# Patient Record
Sex: Female | Born: 1955 | Race: White | Hispanic: No | Marital: Married | Smoking: Never smoker
Health system: Southern US, Community
[De-identification: ages and names within clinical notes are randomized; demographics above are authoritative.]

## PROBLEM LIST (undated history)

## (undated) DIAGNOSIS — N200 Calculus of kidney: Secondary | ICD-10-CM

## (undated) DIAGNOSIS — E079 Disorder of thyroid, unspecified: Secondary | ICD-10-CM

## (undated) DIAGNOSIS — E039 Hypothyroidism, unspecified: Secondary | ICD-10-CM

## (undated) DIAGNOSIS — I341 Nonrheumatic mitral (valve) prolapse: Secondary | ICD-10-CM

## (undated) DIAGNOSIS — R011 Cardiac murmur, unspecified: Secondary | ICD-10-CM

## (undated) DIAGNOSIS — M1990 Unspecified osteoarthritis, unspecified site: Secondary | ICD-10-CM

## (undated) HISTORY — PX: BUNIONECTOMY: SHX129

## (undated) HISTORY — PX: NECK SURGERY: SHX720

## (undated) HISTORY — PX: TONSILLECTOMY: SUR1361

## (undated) HISTORY — PX: APPENDECTOMY: SHX54

---

## 2004-03-25 ENCOUNTER — Ambulatory Visit (HOSPITAL_COMMUNITY): Admission: RE | Admit: 2004-03-25 | Discharge: 2004-03-25 | Payer: Self-pay | Admitting: Neurosurgery

## 2005-07-07 ENCOUNTER — Ambulatory Visit: Payer: Self-pay | Admitting: Unknown Physician Specialty

## 2005-07-07 HISTORY — PX: COLONOSCOPY: SHX174

## 2008-05-27 ENCOUNTER — Ambulatory Visit: Payer: Self-pay | Admitting: Interventional Radiology

## 2008-05-27 ENCOUNTER — Ambulatory Visit: Payer: Self-pay | Admitting: Family Medicine

## 2008-05-27 ENCOUNTER — Emergency Department (HOSPITAL_BASED_OUTPATIENT_CLINIC_OR_DEPARTMENT_OTHER): Admission: EM | Admit: 2008-05-27 | Discharge: 2008-05-27 | Payer: Self-pay | Admitting: Emergency Medicine

## 2008-07-21 ENCOUNTER — Ambulatory Visit (HOSPITAL_COMMUNITY): Admission: RE | Admit: 2008-07-21 | Discharge: 2008-07-23 | Payer: Self-pay | Admitting: Neurosurgery

## 2009-10-21 ENCOUNTER — Encounter: Admission: RE | Admit: 2009-10-21 | Discharge: 2009-10-21 | Payer: Self-pay | Admitting: Neurosurgery

## 2010-03-30 ENCOUNTER — Ambulatory Visit: Payer: Self-pay | Admitting: Family Medicine

## 2010-06-14 LAB — URINALYSIS, ROUTINE W REFLEX MICROSCOPIC
Bilirubin Urine: NEGATIVE
Hgb urine dipstick: NEGATIVE
Ketones, ur: NEGATIVE mg/dL
Nitrite: NEGATIVE
Specific Gravity, Urine: 1.014 (ref 1.005–1.030)
Urobilinogen, UA: 0.2 mg/dL (ref 0.0–1.0)

## 2010-06-14 LAB — CBC
HCT: 40 % (ref 36.0–46.0)
Hemoglobin: 14 g/dL (ref 12.0–15.0)
RBC: 4.4 MIL/uL (ref 3.87–5.11)
RDW: 13.1 % (ref 11.5–15.5)
WBC: 6.5 10*3/uL (ref 4.0–10.5)

## 2010-06-14 LAB — COMPREHENSIVE METABOLIC PANEL
ALT: 29 U/L (ref 0–35)
Alkaline Phosphatase: 69 U/L (ref 39–117)
BUN: 14 mg/dL (ref 6–23)
CO2: 32 mEq/L (ref 19–32)
Chloride: 102 mEq/L (ref 96–112)
GFR calc non Af Amer: >60 mL/min (ref >60)
Glucose, Bld: 85 mg/dL (ref 70–99)
Potassium: 3.7 mEq/L (ref 3.5–5.1)
Sodium: 139 mEq/L (ref 135–145)
Total Bilirubin: 1 mg/dL (ref 0.3–1.2)

## 2010-06-14 LAB — PROTIME-INR: INR: 1 (ref 0.00–1.49)

## 2010-06-14 LAB — DIFFERENTIAL
Basophils Absolute: 0 10*3/uL (ref 0.0–0.1)
Basophils Relative: 1 % (ref 0–1)
Eosinophils Absolute: 0.1 10*3/uL (ref 0.0–0.7)
Monocytes Relative: 7 % (ref 3–12)
Neutro Abs: 4.3 10*3/uL (ref 1.7–7.7)
Neutrophils Relative %: 66 % (ref 43–77)

## 2010-06-14 LAB — APTT: aPTT: 37 seconds (ref 24–37)

## 2010-06-16 LAB — CBC
HCT: 38.2 % (ref 36.0–46.0)
Hemoglobin: 13.5 g/dL (ref 12.0–15.0)
MCHC: 35.3 g/dL (ref 30.0–36.0)
Platelets: 233 10*3/uL (ref 150–400)
RDW: 11.7 % (ref 11.5–15.5)

## 2010-06-16 LAB — DIFFERENTIAL
Basophils Absolute: 0 10*3/uL (ref 0.0–0.1)
Basophils Relative: 0 % (ref 0–1)
Eosinophils Absolute: 0 10*3/uL (ref 0.0–0.7)
Eosinophils Relative: 1 % (ref 0–5)
Monocytes Absolute: 0.4 10*3/uL (ref 0.1–1.0)

## 2010-06-16 LAB — GRAM STAIN

## 2010-06-16 LAB — BASIC METABOLIC PANEL
BUN: 12 mg/dL (ref 6–23)
CO2: 31 mEq/L (ref 19–32)
Glucose, Bld: 96 mg/dL (ref 70–99)
Potassium: 3.9 mEq/L (ref 3.5–5.1)
Sodium: 142 mEq/L (ref 135–145)

## 2010-06-16 LAB — CSF CELL COUNT WITH DIFFERENTIAL
RBC Count, CSF: 1280 /mm3 — ABNORMAL HIGH
Tube #: 1
Tube #: 4

## 2010-06-16 LAB — PROTIME-INR: Prothrombin Time: 13.8 seconds (ref 11.6–15.2)

## 2010-06-16 LAB — CSF CULTURE W GRAM STAIN: Culture: NO GROWTH

## 2010-07-10 ENCOUNTER — Emergency Department: Payer: Self-pay | Admitting: Emergency Medicine

## 2010-07-19 NOTE — H&P (Signed)
NAMESENIAH, Molly Wells              ACCOUNT NO.:  [id number]   MEDICAL RECORD NO.:  [id number]          PATIENT TYPE:  OIB   LOCATION:  3034                         FACILITY:  [facility]   PHYSICIAN:  Payton Doughty, M.D.      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   BODY OF TEXT:  This is a very nice self-referred 55 year old right-  handed white girl, been following for about 4 years with cervical  spondylosis.  She has had increasing pain in her neck and down her arm  on the right and she is now reached the point of intractability with  that and is admitted for an anterior decompression and fusion at C5-6  and C6-7.  Her medical history is remarkable for mitral valve prolapse.  She has had tonsillectomy and appendectomy and bunionectomy.   ALLERGIES:  SULFA, AMOXICILLIN, and BEXTRA.   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   FAMILY HISTORY:  Mom died at 48 with hypertension, arthritis,  hypothyroidism.  She had stroke and diabetes.  Dad died at 14 with  hypertension and pancreatic cancer.   REVIEW OF SYSTEMS:  Remarkable for wearing glasses, neck pain.   PHYSICAL EXAMINATION:  HEENT:  Normal limits.  NECK:  She has good range of motion in her neck, caries are slightly  uniflex.  CHEST:  Clear.  CARDIAC:  Regular rate and rhythm with midsystolic click, fairly  evident.  ABDOMEN:  Nontender.  No hepatosplenomegaly.  EXTREMITIES:  No clubbing, cyanosis.  GU:  Deferred.  Peripheral pulses are good.  NEUROLOGIC:  She is awake, alert, and oriented.  Cranial nerves are  intact.  Motor exam shows 5/5 strength throughout the left upper  extremity and right upper extremity, she has weakness in her right  triceps about 5-/5.  Sensation is diminished in right C6 and C7  distribution.  Reflexes are 2 at the biceps, 2  at the left triceps, 1 at  the right triceps, 1 at the brachioradialis.  Lower extremities are  nonmyelopathic.   MR shows spondylosis at both C5-6 and C6-7.   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   Plans for anterior decompression and fusion at C5-6 and C6-7.  The risks  and benefits have been discussed with her and she wished to proceed.           ______________________________  Payton Doughty, M.D.     MWR/MEDQ  D:  07/21/2008  T:  07/21/2008  Job:  [id number]

## 2010-07-19 NOTE — Op Note (Signed)
NAMEJANISA, LABUS              ACCOUNT NO.:  [id number]   MEDICAL RECORD NO.:  [id number]          PATIENT TYPE:  OIB   LOCATION:  3034                         FACILITY:  [facility]   PHYSICIAN:  Payton Doughty, M.D.      DATE OF BIRTH:  Jan 14, 1956   DATE OF PROCEDURE:  07/21/2008  DATE OF DISCHARGE:                               OPERATIVE REPORT   Jul 21, 2008   PREOPERATIVE DIAGNOSIS:  Spondylosis C5-C6 and C6-C7.   POSTOPERATIVE DIAGNOSIS:  Spondylosis C5-C6 and C6-C7.   PROCEDURE:  C5-C6 and C6-C7 anterior decompression and fusion.   SURGEON:  Payton Doughty, MD   ANESTHESIA:  General endotracheal.   PREPARATION:  Prepped and draped with alcohol wipe.   COMPLICATIONS:  None.   NURSE ASSISTANT:  Bedelia Person, MD   DOCTOR ASSISTANCE:  Hewitt Shorts, MD   BODY OF TEXT:  A 55 year old girl with spondylosis at C5-C6 and C6-C7  taken to the operative room, smoothly anesthetized and intubated and was  placed spine on the operating table.  Following shave, prep, and drape  in the usual sterile fashion, the skin was incised from midline to  medial border of the sternocleidomastoid muscle on the left side.  The  platysma was identified, elevated, divided and undermined.  The  sternocleidomastoid was identified.  Medial dissection revealed the  carotid artery tracked laterally to the left.  Trachea and esophagus  were retracted laterally to the right exposing the bones of the anterior  cervical spine.  Marker was placed.  Intraoperative x-ray obtained,  confirmed correctness while under hand retraction of the esophagus and  trachea a diskectomy was carried out under gross observation of both C5-  C6 and C6-C7.  The operating microscope was then brought in.  We used  microdissection technique to remove the remaining disks, explored the  neural foramina, divided the posterior longitudinal ligament and removed  the offending osteophytes.  It was found that the spondylosis was a  little  bit worse at C6-C7 than it was at C5-C6 with significant at both  levels, worse on the right than on the left.  Following complete  decompression, similar bone grafts were fashioned patellar allograft and  tapped into place.  A 32-mm Reflex Hybrid plate was placed using 12-mm  screws two in C5, two in C6 and two in C7.  Intraoperative x-ray showed  good placement of bone graft, plate and screws.  Wound was irrigated.  Hemostasis assured.  Successive layers of 3-0 Vicryl and 4-0 Vicryl were  used to close the platysma, subcutaneous tissue and skin respectively.  Benzoin and Steri-Strips were placed and made occlusive with Telfa and  OpSite.  The patient placed in Aspen collar and returned to recovery  room in good condition.   .           ______________________________  Payton Doughty, M.D.     MWR/MEDQ  D:  07/21/2008  T:  07/21/2008  Job:  [id number]

## 2011-02-20 ENCOUNTER — Ambulatory Visit: Payer: Self-pay

## 2011-07-02 ENCOUNTER — Ambulatory Visit: Payer: Self-pay | Admitting: Family Medicine

## 2011-07-04 LAB — BETA STREP CULTURE(ARMC)

## 2014-03-12 ENCOUNTER — Ambulatory Visit: Payer: Self-pay | Admitting: Podiatry

## 2015-07-18 ENCOUNTER — Ambulatory Visit
Admission: EM | Admit: 2015-07-18 | Discharge: 2015-07-18 | Disposition: A | Discharge status: Home / Self Care | Payer: Managed Care, Other (non HMO) | Attending: Family Medicine | Admitting: Family Medicine

## 2015-07-18 ENCOUNTER — Ambulatory Visit (INDEPENDENT_AMBULATORY_CARE_PROVIDER_SITE_OTHER): Payer: Managed Care, Other (non HMO)

## 2015-07-18 DIAGNOSIS — S63501A Unspecified sprain of right wrist, initial encounter: Secondary | ICD-10-CM

## 2015-07-18 DIAGNOSIS — M19039 Primary osteoarthritis, unspecified wrist: Secondary | ICD-10-CM

## 2015-07-18 DIAGNOSIS — M129 Arthropathy, unspecified: Secondary | ICD-10-CM | POA: Diagnosis not present

## 2015-07-18 HISTORY — DX: Nonrheumatic mitral (valve) prolapse: I34.1

## 2015-07-18 HISTORY — DX: Disorder of thyroid, unspecified: E07.9

## 2015-07-18 HISTORY — DX: Unspecified osteoarthritis, unspecified site: M19.90

## 2015-07-18 MED ORDER — TRIAMCINOLONE ACETONIDE 40 MG/ML IJ SUSP
40.0000 mg | Freq: Once | INTRAMUSCULAR | Status: AC
  Administered 2015-07-18: 40 mg via INTRA_ARTICULAR

## 2015-07-18 MED ORDER — ETODOLAC 500 MG PO TABS: 500.0000 mg | ORAL_TABLET | Freq: Two times a day (BID) | ORAL | Status: AC

## 2015-07-18 NOTE — ED Notes (Signed)
Right hand and wrist pain onset today very painful when she woke up plays piano and has little swelling and Hx of arthritis.

## 2015-07-18 NOTE — ED Provider Notes (Signed)
CSN: [id number]     Arrival date & time 07/18/15  6578 History   First MD Initiated Contact with Patient 07/18/15 0930     Chief Complaint  Patient presents with  . Wrist Pain    Right side   HPI the patient presents today for evaluation of the right wrist pain. The pain began this morning when the patient 1st woke up. She was supposed to go to church and played piano however the pain was too severe that she elected to be seen by urgent care. Patient denies any recent falls or trauma to the right wrist, denies any numbness or tingling to the right upper extremity. The pain is located along the ulnar aspect of the distal wrist, she describes opinions and aching sensation which is worse with range of motion. Pain is also worse when she attempts to grab objects or tries to make a full composite fist. 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 Denies any history of Bites or tick bites. She does note that she will often toss and turn in her sleep.  Past Medical History  Diagnosis Date  . Arthritis   . Thyroid disease   . Mitral valve prolapse    Past Surgical History  Procedure Laterality Date  . Appendectomy    . Tonsillectomy    . Neck surgery      cervical fusion  . Bunionectomy     No family history on file. Social History  Substance Use Topics  . Smoking status: Never Smoker   . Smokeless tobacco: None  . Alcohol Use: No   OB History    No data available     Review of Systems  Constitutional: Negative.   HENT: Negative.   Eyes: Negative.   Respiratory: Negative.   Cardiovascular: Negative.   Gastrointestinal: Negative.   Endocrine: Negative.   Genitourinary: Negative.   Musculoskeletal: Positive for myalgias, joint swelling and arthralgias.  Skin: Negative.   Allergic/Immunologic: Negative.   Neurological: Negative.   Hematological: Negative.   Psychiatric/Behavioral: Negative.     Allergies  Amoxicillin and  Sulfa antibiotics  Home Medications   Prior to Admission medications   Medication Sig Start Date End Date Taking? Authorizing Provider  levothyroxine (SYNTHROID, LEVOTHROID) 50 MCG tablet Take 50 mcg by mouth daily before breakfast.   Yes Historical Provider, MD  naproxen (NAPROSYN) 500 MG tablet Take 500 mg by mouth as needed.   Yes Historical Provider, MD  etodolac (LODINE) 500 MG tablet Take 1 tablet (500 mg total) by mouth 2 (two) times daily. 07/18/15   Anson Oregon, PA-C   Meds Ordered and Administered this Visit   Medications  triamcinolone acetonide (KENALOG-40) injection 40 mg (40 mg Intra-articular Given 07/18/15 1109)    BP 142/79 mmHg  Pulse 70  Temp(Src) 98.1 F (36.7 C) (Oral)  Resp 16  Ht 5' 3.5" (1.613 m)  Wt 127 lb (57.607 kg)  BMI 22.14 kg/m2  SpO2 100%  LMP  No data found.   Physical Exam  skin inspection of the right wrist demonstrates mild swelling along the dorsal, ulnar aspect. The patient has pain with palpation of the distal ulna, at the area of the ulnar collateral ligament. Patient denies any parent palpation of the radial aspect of the wrist. No anatomic snuffbox tenderness. Patient has full range of motion to the right wrist and extension, flexion, ulnar deviation in radial deviation. Patient has mild pain with wrist extension,  moderate pain with owner deviation. Patient is able make it for composite fist, 5+/font grip strength. Patient is intact light touch the right upper extremity. No triggering or liking of digits.  ED Course  Injection of joint Date/Time: 07/18/2015 11:20 AM Performed by: Anson OregonMCGHEE, Saylor Murry LANCE Authorized by: Hassan RowanWADE, EUGENE Consent: Verbal consent obtained. Risks and benefits: risks, benefits and alternatives were discussed Consent given by: patient Patient understanding: patient states understanding of the procedure being performed Patient consent: the patient's understanding of the procedure matches consent given Procedure  consent: procedure consent matches procedure scheduled Relevant documents: relevant documents present and verified Test results: test results available and properly labeled Site marked: the operative site was marked Imaging studies: imaging studies available Patient identity confirmed: verbally with patient Local anesthesia used: no Patient sedated: no Comments: Skin was cleaned prior to injection with alcohol swab.  1 cc of Kenalog-40 was injected into the area of the Ulnar collateral ligament from the dorsal aspect of the right hand.   (including critical care time)  Labs Review Labs Reviewed - No data to display  Imaging Review Dg Hand Complete Right  07/18/2015  CLINICAL DATA:  History of arthritis woke up this morning with medial right hand pain. No trauma. EXAM: RIGHT HAND - COMPLETE 3+ VIEW COMPARISON:  None. FINDINGS: Mild degenerative change over the wrist. No evidence of fracture or dislocation. Normal mineralization and alignment. No evidence of bony erosions. IMPRESSION: No acute findings. Electronically Signed   By: Elberta Fortisaniel  Boyle M.D.   On: 07/18/2015 11:07   MDM   1. Wrist arthritis   2. Wrist sprain, right, initial encounter    1.  Treatment options were discussed today with the patient. 2.  Pt was offered and received a right wrist kenalog injection, described above. 3.  Lodine prescription written and patient provided with a velcro wrist splint. 4.  She will follow-up with her orthopaedic surgeon if she continues to have pain after 14 day.    Anson OregonJames Lance Rayland Hamed, [place] JerseyPA-C 07/18/15 1128

## 2015-07-18 NOTE — Discharge Instructions (Signed)
Wrist Sprain With Rehab A sprain is an injury in which a ligament that maintains the proper alignment of a joint is partially or completely torn. The ligaments of the wrist are susceptible to sprains. Sprains are classified into three categories. Grade 1 sprains cause pain, but the tendon is not lengthened. Grade 2 sprains include a lengthened ligament because the ligament is stretched or partially ruptured. With grade 2 sprains there is still function, although the function may be diminished. Grade 3 sprains are characterized by a complete tear of the tendon or muscle, and function is usually impaired. SYMPTOMS   Pain tenderness, inflammation, and/or bruising (contusion) of the injury.  A "pop" or tear felt and/or heard at the time of injury.  Decreased wrist function. CAUSES  A wrist sprain occurs when a force is placed on one or more ligaments that is greater than it/they can withstand. Common mechanisms of injury include:  Catching a ball with your hands.  Repetitive and/ or strenuous extension or flexion of the wrist. RISK INCREASES WITH:  Previous wrist injury.  Contact sports (boxing or wrestling).  Activities in which falling is common.  Poor strength and flexibility.  Improperly fitted or padded protective equipment. PREVENTION  Warm up and stretch properly before activity.  Allow for adequate recovery between workouts.  Maintain physical fitness:  Strength, flexibility, and endurance.  Cardiovascular fitness.  Protect the wrist joint by limiting its motion with the use of taping, braces, or splints.  Protect the wrist after injury for 6 to 12 months. PROGNOSIS  The prognosis for wrist sprains depends on the degree of injury. Grade 1 sprains require 2 to 6 weeks of treatment. Grade 2 sprains require 6 to 8 weeks of treatment, and grade 3 sprains require up to 12 weeks.  RELATED COMPLICATIONS   Prolonged healing time, if improperly treated or  re-injured.  Recurrent symptoms that result in a chronic problem.  Injury to nearby structures (bone, cartilage, nerves, or tendons).  Arthritis of the wrist.  Inability to compete in athletics at a high level.  Wrist stiffness or weakness.  Progression to a complete rupture of the ligament. TREATMENT  Treatment initially involves resting from any activities that aggravate the symptoms, and the use of ice and medications to help reduce pain and inflammation. Your caregiver may recommend immobilizing the wrist for a period of time in order to reduce stress on the ligament and allow for healing. After immobilization it is important to perform strengthening and stretching exercises to help regain strength and a full range of motion. These exercises may be completed at home or with a therapist. Surgery is not usually required for wrist sprains, unless the ligament has been ruptured (grade 3 sprain). MEDICATION   If pain medication is necessary, then nonsteroidal anti-inflammatory medications, such as aspirin and ibuprofen, or other minor pain relievers, such as acetaminophen, are often recommended.  Do not take pain medication for 7 days before surgery.  Prescription pain relievers may be given if deemed necessary by your caregiver. Use only as directed and only as much as you need. HEAT AND COLD  Cold treatment (icing) relieves pain and reduces inflammation. Cold treatment should be applied for 10 to 15 minutes every 2 to 3 hours for inflammation and pain and immediately after any activity that aggravates your symptoms. Use ice packs or massage the area with a piece of ice (ice massage).  Heat treatment may be used prior to performing the stretching and strengthening activities prescribed by your   caregiver, physical therapist, or athletic trainer. Use a heat pack or soak your injury in warm water. SEEK MEDICAL CARE IF:  Treatment seems to offer no benefit, or the condition worsens.  Any  medications produce adverse side effects. EXERCISES RANGE OF MOTION (ROM) AND STRETCHING EXERCISES - Wrist Sprain  These exercises may help you when beginning to rehabilitate your injury. Your symptoms may resolve with or without further involvement from your physician, physical therapist or athletic trainer. While completing these exercises, remember:   Restoring tissue flexibility helps normal motion to return to the joints. This allows healthier, less painful movement and activity.  An effective stretch should be held for at least 30 seconds.  A stretch should never be painful. You should only feel a gentle lengthening or release in the stretched tissue. RANGE OF MOTION - Wrist Flexion, Active-Assisted  Extend your right / left elbow with your fingers pointing down.*  Gently pull the back of your hand towards you until you feel a gentle stretch on the top of your forearm.  Hold this position for __________ seconds. Repeat __________ times. Complete this exercise __________ times per day.  *If directed by your physician, physical therapist or athletic trainer, complete this stretch with your elbow bent rather than extended. RANGE OF MOTION - Wrist Extension, Active-Assisted  Extend your right / left elbow and turn your palm upwards.*  Gently pull your palm/fingertips back so your wrist extends and your fingers point more toward the ground.  You should feel a gentle stretch on the inside of your forearm.  Hold this position for __________ seconds. Repeat __________ times. Complete this exercise __________ times per day. *If directed by your physician, physical therapist or athletic trainer, complete this stretch with your elbow bent, rather than extended. RANGE OF MOTION - Supination, Active  Stand or sit with your elbows at your side. Bend your right / left elbow to 90 degrees.  Turn your palm upward until you feel a gentle stretch on the inside of your forearm.  Hold this  position for __________ seconds. Slowly release and return to the starting position. Repeat __________ times. Complete this stretch __________ times per day.  RANGE OF MOTION - Pronation, Active  Stand or sit with your elbows at your side. Bend your right / left elbow to 90 degrees.  Turn your palm downward until you feel a gentle stretch on the top of your forearm.  Hold this position for __________ seconds. Slowly release and return to the starting position. Repeat __________ times. Complete this stretch __________ times per day.  STRETCH - Wrist Flexion  Place the back of your right / left hand on a tabletop leaving your elbow slightly bent. Your fingers should point away from your body.  Gently press the back of your hand down onto the table by straightening your elbow. You should feel a stretch on the top of your forearm.  Hold this position for __________ seconds. Repeat __________ times. Complete this stretch __________ times per day.  STRETCH - Wrist Extension  Place your right / left fingertips on a tabletop leaving your elbow slightly bent. Your fingers should point backwards.  Gently press your fingers and palm down onto the table by straightening your elbow. You should feel a stretch on the inside of your forearm.  Hold this position for __________ seconds. Repeat __________ times. Complete this stretch __________ times per day.  STRENGTHENING EXERCISES - Wrist Sprain These exercises may help you when beginning to rehabilitate your injury.   They may resolve your symptoms with or without further involvement from your physician, physical therapist or athletic trainer. While completing these exercises, remember:   Muscles can gain both the endurance and the strength needed for everyday activities through controlled exercises.  Complete these exercises as instructed by your physician, physical therapist or athletic trainer. Progress with the resistance and repetition exercises  only as your caregiver advises. STRENGTH - Wrist Flexors  Sit with your right / left forearm palm-up and fully supported. Your elbow should be resting below the height of your shoulder. Allow your wrist to extend over the edge of the surface.  Loosely holding a __________ weight or a piece of rubber exercise band/tubing, slowly curl your hand up toward your forearm.  Hold this position for __________ seconds. Slowly lower the wrist back to the starting position in a controlled manner. Repeat __________ times. Complete this exercise __________ times per day.  STRENGTH - Wrist Extensors  Sit with your right / left forearm palm-down and fully supported. Your elbow should be resting below the height of your shoulder. Allow your wrist to extend over the edge of the surface.  Loosely holding a __________ weight or a piece of rubber exercise band/tubing, slowly curl your hand up toward your forearm.  Hold this position for __________ seconds. Slowly lower the wrist back to the starting position in a controlled manner. Repeat __________ times. Complete this exercise __________ times per day.  STRENGTH - Ulnar Deviators  Stand with a ____________________ weight in your right / left hand, or sit holding on to the rubber exercise band/tubing with your opposite arm supported.  Move your wrist so that your pinkie travels toward your forearm and your thumb moves away from your forearm.  Hold this position for __________ seconds and then slowly lower the wrist back to the starting position. Repeat __________ times. Complete this exercise __________ times per day STRENGTH - Radial Deviators  Stand with a ____________________ weight in your  right / left hand, or sit holding on to the rubber exercise band/tubing with your arm supported.  Raise your hand upward in front of you or pull up on the rubber tubing.  Hold this position for __________ seconds and then slowly lower the wrist back to the  starting position. Repeat __________ times. Complete this exercise __________ times per day. STRENGTH - Forearm Supinators  Sit with your right / left forearm supported on a table, keeping your elbow below shoulder height. Rest your hand over the edge, palm down.  Gently grip a hammer or a soup ladle.  Without moving your elbow, slowly turn your palm and hand upward to a "thumbs-up" position.  Hold this position for __________ seconds. Slowly return to the starting position. Repeat __________ times. Complete this exercise __________ times per day.  STRENGTH - Forearm Pronators  Sit with your right / left forearm supported on a table, keeping your elbow below shoulder height. Rest your hand over the edge, palm up.  Gently grip a hammer or a soup ladle.  Without moving your elbow, slowly turn your palm and hand upward to a "thumbs-up" position.  Hold this position for __________ seconds. Slowly return to the starting position. Repeat __________ times. Complete this exercise __________ times per day.  STRENGTH - Grip  Grasp a tennis ball, a dense sponge, or a large, rolled sock in your hand.  Squeeze as hard as you can without increasing any pain.  Hold this position for __________ seconds. Release your grip slowly.   Repeat __________ times. Complete this exercise __________ times per day.    This information is not intended to replace advice given to you by your health care provider. Make sure you discuss any questions you have with your health care provider.   Document Released: 02/20/2005 Document Revised: 11/11/2014 Document Reviewed: 06/04/2008 Elsevier Interactive Patient Education 2016 [employer]  

## 2015-08-05 ENCOUNTER — Encounter: Payer: Self-pay | Admitting: *Deleted

## 2015-08-06 ENCOUNTER — Ambulatory Visit: Payer: Managed Care, Other (non HMO) | Admitting: Certified Registered"

## 2015-08-06 ENCOUNTER — Encounter
Admission: RE | Disposition: A | Discharge status: Home / Self Care | Payer: Self-pay | Source: Ambulatory Visit | Attending: Unknown Physician Specialty

## 2015-08-06 ENCOUNTER — Encounter: Payer: Self-pay | Admitting: *Deleted

## 2015-08-06 ENCOUNTER — Ambulatory Visit
Admission: RE | Admit: 2015-08-06 | Discharge: 2015-08-06 | Disposition: A | Discharge status: Home / Self Care | Payer: Managed Care, Other (non HMO) | Source: Ambulatory Visit | Attending: Unknown Physician Specialty | Admitting: Unknown Physician Specialty

## 2015-08-06 DIAGNOSIS — Z1211 Encounter for screening for malignant neoplasm of colon: Secondary | ICD-10-CM | POA: Diagnosis not present

## 2015-08-06 DIAGNOSIS — M1990 Unspecified osteoarthritis, unspecified site: Secondary | ICD-10-CM | POA: Diagnosis not present

## 2015-08-06 DIAGNOSIS — E039 Hypothyroidism, unspecified: Secondary | ICD-10-CM | POA: Insufficient documentation

## 2015-08-06 DIAGNOSIS — Z87442 Personal history of urinary calculi: Secondary | ICD-10-CM | POA: Insufficient documentation

## 2015-08-06 DIAGNOSIS — I341 Nonrheumatic mitral (valve) prolapse: Secondary | ICD-10-CM | POA: Diagnosis not present

## 2015-08-06 DIAGNOSIS — Z79899 Other long term (current) drug therapy: Secondary | ICD-10-CM | POA: Diagnosis not present

## 2015-08-06 DIAGNOSIS — R011 Cardiac murmur, unspecified: Secondary | ICD-10-CM | POA: Insufficient documentation

## 2015-08-06 HISTORY — DX: Hypothyroidism, unspecified: E03.9

## 2015-08-06 HISTORY — DX: Cardiac murmur, unspecified: R01.1

## 2015-08-06 HISTORY — DX: Calculus of kidney: N20.0

## 2015-08-06 HISTORY — PX: COLONOSCOPY WITH PROPOFOL: SHX5780

## 2015-08-06 SURGERY — COLONOSCOPY WITH PROPOFOL
Anesthesia: General

## 2015-08-06 MED ORDER — PROPOFOL 500 MG/50ML IV EMUL
INTRAVENOUS | Status: DC | PRN
  Administered 2015-08-06: 120 ug/kg/min via INTRAVENOUS

## 2015-08-06 MED ORDER — PROPOFOL 10 MG/ML IV BOLUS
INTRAVENOUS | Status: DC | PRN
  Administered 2015-08-06: 30 mg via INTRAVENOUS
  Administered 2015-08-06: 70 mg via INTRAVENOUS

## 2015-08-06 MED ORDER — SODIUM CHLORIDE 0.9 % IV SOLN
INTRAVENOUS | Status: DC
  Administered 2015-08-06: 15:00:00 via INTRAVENOUS

## 2015-08-06 MED ORDER — MIDAZOLAM HCL 5 MG/5ML IJ SOLN
INTRAMUSCULAR | Status: DC | PRN
  Administered 2015-08-06: 1 mg via INTRAVENOUS

## 2015-08-06 MED ORDER — LIDOCAINE 2% (20 MG/ML) 5 ML SYRINGE
INTRAMUSCULAR | Status: DC | PRN
  Administered 2015-08-06: 50 mg via INTRAVENOUS

## 2015-08-06 MED ORDER — SODIUM CHLORIDE 0.9 % IV SOLN: INTRAVENOUS | Status: DC

## 2015-08-06 NOTE — Anesthesia Preprocedure Evaluation (Signed)
Anesthesia Evaluation  Patient identified by MRN, date of birth, ID band Patient awake    Reviewed: Allergy & Precautions, NPO status , Patient's Chart, lab work & pertinent test results, reviewed documented beta blocker date and time   Airway Mallampati: II  TM Distance: >3 FB     Dental  (+) Chipped   Pulmonary           Cardiovascular + Valvular Problems/Murmurs MVP      Neuro/Psych    GI/Hepatic   Endo/Other  Hypothyroidism   Renal/GU Renal InsufficiencyRenal disease     Musculoskeletal  (+) Arthritis ,   Abdominal   Peds  Hematology   Anesthesia Other Findings Neck movement adequate.  Reproductive/Obstetrics                             Anesthesia Physical Anesthesia Plan  ASA: III  Anesthesia Plan: General   Post-op Pain Management:    Induction: Intravenous  Airway Management Planned: Nasal Cannula  Additional Equipment:   Intra-op Plan:   Post-operative Plan:   Informed Consent: I have reviewed the patients History and Physical, chart, labs and discussed the procedure including the risks, benefits and alternatives for the proposed anesthesia with the patient or authorized representative who has indicated his/her understanding and acceptance.     Plan Discussed with: CRNA  Anesthesia Plan Comments:         Anesthesia Quick Evaluation

## 2015-08-06 NOTE — Op Note (Signed)
[facility] Patient Name: Molly Wells Procedure Date: 08/06/2015 3:42 PM MRN: [id number] Account #: [id number] Date of Birth: May 15, 1955 Admit Type: Outpatient Age: 60 Room: [facility] ENDO ROOM 4 Gender: Female Note Status: Finalized Procedure:            Colonoscopy Indications:          Screening for colorectal malignant neoplasm Providers:            Scot Jun, MD Referring MD:         Marylin Crosby. Greggory Stallion, MD (Referring MD) Medicines:            Propofol per Anesthesia Complications:        No immediate complications. Procedure:            Pre-Anesthesia Assessment:                       - After reviewing the risks and benefits, the patient                        was deemed in satisfactory condition to undergo the                        procedure.                       After obtaining informed consent, the colonoscope was                        passed under direct vision. Throughout the procedure,                        the patient's blood pressure, pulse, and oxygen                        saturations were monitored continuously. The                        Colonoscope was introduced through the anus and                        advanced to the the cecum, identified by appendiceal                        orifice and ileocecal valve. The colonoscopy was                        performed without difficulty. The patient tolerated the                        procedure well. The quality of the bowel preparation                        was excellent. Findings:      The entire examined colon appeared normal. No significant hemorrhoids       seen. Impression:           - The entire examined colon is normal.                       - No specimens collected. Recommendation:       - Repeat colonoscopy in 10 years for screening purposes.  Scot Junobert T Braylynn Lewing, MD 08/06/2015 4:49:24 PM This report has been signed electronically. Number of Addenda: 0 Note Initiated  On: 08/06/2015 3:42 PM Scope Withdrawal Time: 0 hours 7 minutes 15 seconds  Total Procedure Duration: 0 hours 14 minutes 0 seconds       [facility]

## 2015-08-06 NOTE — H&P (Signed)
   Primary Care Physician:  Rayetta HumphreyGeorge, Sionne A, MD Primary Gastroenterologist:  Dr. Mechele CollinElliott  Pre-Procedure History & Physical: HPI:  Molly Wells is a 60 y.o. female is here for an colonoscopy.   Past Medical History  Diagnosis Date  . Arthritis   . Thyroid disease   . Mitral valve prolapse   . Hypothyroidism   . Kidney stones   . Heart murmur     Past Surgical History  Procedure Laterality Date  . Appendectomy    . Tonsillectomy    . Neck surgery      cervical fusion  . Bunionectomy    . Colonoscopy  07/07/2005    Prior to Admission medications   Medication Sig Start Date End Date Taking? Authorizing Provider  levothyroxine (SYNTHROID, LEVOTHROID) 50 MCG tablet Take 50 mcg by mouth daily before breakfast.   Yes Historical Provider, MD  etodolac (LODINE) 500 MG tablet Take 1 tablet (500 mg total) by mouth 2 (two) times daily. 07/18/15   Anson OregonJames Lance McGhee, PA-C  naproxen (NAPROSYN) 500 MG tablet Take 500 mg by mouth as needed.    Historical Provider, MD    Allergies as of 07/27/2015 - Review Complete 07/18/2015  Allergen Reaction Noted  . Amoxicillin Rash 07/18/2015  . Sulfa antibiotics Rash 07/18/2015    History reviewed. No pertinent family history.  Social History   Social History  . Marital Status: Married    Spouse Name: N/A  . Number of Children: N/A  . Years of Education: N/A   Occupational History  . Not on file.   Social History Main Topics  . Smoking status: Never Smoker   . Smokeless tobacco: Never Used  . Alcohol Use: No  . Drug Use: No  . Sexual Activity: Not on file   Other Topics Concern  . Not on file   Social History Narrative    Review of Systems: See HPI, otherwise negative ROS  Physical Exam: BP 149/82 mmHg  Pulse 80  Temp(Src) 97.7 F (36.5 C) (Tympanic)  Resp 18  Ht 5\' 5"  (1.651 m)  Wt 55.792 kg (123 lb)  BMI 20.47 kg/m2  SpO2 100% General:   Alert,  pleasant and cooperative in NAD Head:  Normocephalic and  atraumatic. Neck:  Supple; no masses or thyromegaly. Lungs:  Clear throughout to auscultation.    Heart:  Regular rate and rhythm. Abdomen:  Soft, nontender and nondistended. Normal bowel sounds, without guarding, and without rebound.   Neurologic:  Alert and  oriented x4;  grossly normal neurologically.  Impression/Plan: Molly Wells is here for an colonoscopy to be performed for screening  Risks, benefits, limitations, and alternatives regarding  colonoscopy have been reviewed with the patient.  Questions have been answered.  All parties agreeable.   Lynnae PrudeELLIOTT, ROBERT, MD  08/06/2015, 3:50 PM

## 2015-08-06 NOTE — Transfer of Care (Signed)
Immediate Anesthesia Transfer of Care Note  Patient: Molly Wells  Procedure(s) Performed: Procedure(s): COLONOSCOPY WITH PROPOFOL (N/A)  Patient Location: Endoscopy Unit  Anesthesia Type:General  Level of Consciousness: awake  Airway & Oxygen Therapy: Patient Spontanous Breathing and Patient connected to nasal cannula oxygen  Post-op Assessment: Report given to RN and Post -op Vital signs reviewed and stable  Post vital signs: Reviewed  Last Vitals:  Filed Vitals:   08/06/15 1431 08/06/15 1616  BP: 149/82 115/73  Pulse: 80 67  Temp: 36.5 C 36.2 C  Resp: 18 18    Last Pain: There were no vitals filed for this visit.       Complications: No apparent anesthesia complications

## 2015-08-06 NOTE — Anesthesia Postprocedure Evaluation (Signed)
Anesthesia Post Note  Patient: Molly Wells  Procedure(s) Performed: Procedure(s) (LRB): COLONOSCOPY WITH PROPOFOL (N/A)  Patient location during evaluation: PACU Anesthesia Type: General Level of consciousness: awake and alert Pain management: pain level controlled Vital Signs Assessment: post-procedure vital signs reviewed and stable Respiratory status: spontaneous breathing, nonlabored ventilation, respiratory function stable and patient connected to nasal cannula oxygen Cardiovascular status: blood pressure returned to baseline and stable Postop Assessment: no signs of nausea or vomiting Anesthetic complications: no    Last Vitals:  Filed Vitals:   08/06/15 1636 08/06/15 1646  BP: 138/86 133/80  Pulse: 65 59  Temp:    Resp: 15 17    Last Pain: There were no vitals filed for this visit.               Yevette EdwardsJames G Hebah Bogosian

## 2015-08-09 ENCOUNTER — Encounter: Payer: Self-pay | Admitting: Unknown Physician Specialty

## 2016-04-13 ENCOUNTER — Ambulatory Visit
Admission: EM | Admit: 2016-04-13 | Discharge: 2016-04-13 | Disposition: A | Discharge status: Home / Self Care | Payer: Managed Care, Other (non HMO) | Attending: Family Medicine | Admitting: Family Medicine

## 2016-04-13 ENCOUNTER — Encounter: Payer: Self-pay | Admitting: *Deleted

## 2016-04-13 DIAGNOSIS — R300 Dysuria: Secondary | ICD-10-CM | POA: Diagnosis not present

## 2016-04-13 DIAGNOSIS — N390 Urinary tract infection, site not specified: Secondary | ICD-10-CM

## 2016-04-13 LAB — URINALYSIS, COMPLETE (UACMP) WITH MICROSCOPIC
Bilirubin Urine: NEGATIVE
GLUCOSE, UA: NEGATIVE mg/dL
Ketones, ur: NEGATIVE mg/dL
Nitrite: POSITIVE — AB
PH: 5.5 (ref 5.0–8.0)
Specific Gravity, Urine: 1.01 (ref 1.005–1.030)

## 2016-04-13 MED ORDER — NITROFURANTOIN MONOHYD MACRO 100 MG PO CAPS: 100.0000 mg | ORAL_CAPSULE | Freq: Two times a day (BID) | ORAL | 0 refills | Status: AC

## 2016-04-13 MED ORDER — PHENAZOPYRIDINE HCL 200 MG PO TABS: 200.0000 mg | ORAL_TABLET | Freq: Three times a day (TID) | ORAL | 0 refills | Status: AC | PRN

## 2016-04-13 NOTE — ED Provider Notes (Signed)
[facility]    CSN: [id number] Arrival date & time: 04/13/16  1306     History   Chief Complaint Chief Complaint  Patient presents with  . Urinary Frequency    HPI Molly Wells is a 61 y.o. female.   She is here because of symptoms of a UTI. She's had UTIs before but not recently. This 61 year old white female reports having symptoms for about 3 days or on Tuesday but then this morning the symptoms got much more severe and while she initially thought that this may be something involving kidney having a kidney stone she realizes morning this was a UTI because of the burning and urination frequency and irritation. She has history arthritis hypothyroidism heart murmur kidney stones much palpable left thyroid disease. Previous surgeries include appendectomy bunionectomy, and tonsillectomy. She never smoked. She is allergic to sulfa drugs and amoxicillin. No pertinent family medical history relevant to today's visit   The history is provided by the patient. No language interpreter was used.  Urinary Frequency  This is a new problem. The current episode started more than 2 days ago. The problem occurs constantly. The problem has been gradually worsening. Pertinent negatives include no chest pain, no abdominal pain, no headaches and no shortness of breath. Nothing aggravates the symptoms. Nothing relieves the symptoms. She has tried nothing for the symptoms. The treatment provided no relief.    Past Medical History:  Diagnosis Date  . Arthritis   . Heart murmur   . Hypothyroidism   . Kidney stones   . Mitral valve prolapse   . Thyroid disease     There are no active problems to display for this patient.   Past Surgical History:  Procedure Laterality Date  . APPENDECTOMY    . BUNIONECTOMY    . COLONOSCOPY  07/07/2005  . COLONOSCOPY WITH PROPOFOL N/A 08/06/2015   Procedure: COLONOSCOPY WITH PROPOFOL;  Surgeon: Scot Junobert T Elliott, MD;  [facility];  Service:  Endoscopy;  Laterality: N/A;  . NECK SURGERY     cervical fusion  . TONSILLECTOMY      OB History    No data available       Home Medications    Prior to Admission medications   Medication Sig Start Date End Date Taking? Authorizing Provider  levothyroxine (SYNTHROID, LEVOTHROID) 50 MCG tablet Take 50 mcg by mouth daily before breakfast.   Yes Historical Provider, MD  etodolac (LODINE) 500 MG tablet Take 1 tablet (500 mg total) by mouth 2 (two) times daily. 07/18/15   Anson OregonJames Lance McGhee, PA-C  naproxen (NAPROSYN) 500 MG tablet Take 500 mg by mouth as needed.    Historical Provider, MD  nitrofurantoin, macrocrystal-monohydrate, (MACROBID) 100 MG capsule Take 1 capsule (100 mg total) by mouth 2 (two) times daily. 04/13/16   Hassan RowanEugene Jacque Byron, MD  phenazopyridine (PYRIDIUM) 200 MG tablet Take 1 tablet (200 mg total) by mouth 3 (three) times daily as needed for pain. 04/13/16   Hassan RowanEugene Nicki Gracy, MD    Family History History reviewed. No pertinent family history.  Social History Social History  Substance Use Topics  . Smoking status: Never Smoker  . Smokeless tobacco: Never Used  . Alcohol use No     Allergies   Amoxicillin and Sulfa antibiotics   Review of Systems Review of Systems  Respiratory: Negative for shortness of breath.   Cardiovascular: Negative for chest pain.  Gastrointestinal: Negative for abdominal pain.  Genitourinary: Positive for frequency.  Neurological: Negative for headaches.  All other systems reviewed and are negative.    Physical Exam Triage Vital Signs ED Triage Vitals  Enc Vitals Group     BP 04/13/16 1354 (!) 142/83     Pulse Rate 04/13/16 1354 92     Resp 04/13/16 1354 16     Temp 04/13/16 1354 98.1 F (36.7 C)     Temp Source 04/13/16 1354 Oral     SpO2 04/13/16 1354 100 %     Weight 04/13/16 1356 125 lb (56.7 kg)     Height 04/13/16 1356 5\' 5"  (1.651 m)     Head Circumference --      Peak Flow --      Pain Score 04/13/16 1400 0     Pain Loc  --      Pain Edu? --      Excl. in GC? --    No data found.   Updated Vital Signs BP (!) 142/83 (BP Location: Left Arm)   Pulse 92   Temp 98.1 F (36.7 C) (Oral)   Resp 16   Ht 5\' 5"  (1.651 m)   Wt 125 lb (56.7 kg)   SpO2 100%   BMI 20.80 kg/m   Visual Acuity Right Eye Distance:   Left Eye Distance:   Bilateral Distance:    Right Eye Near:   Left Eye Near:    Bilateral Near:     Physical Exam  Constitutional: She is oriented to person, place, and time. She appears well-developed and well-nourished.  HENT:  Head: Normocephalic and atraumatic.  Eyes: Pupils are equal, round, and reactive to light.  Neck: Normal range of motion.  Pulmonary/Chest: Effort normal.  Abdominal: Soft. Normal appearance and bowel sounds are normal. There is no hepatosplenomegaly. There is no CVA tenderness.  Musculoskeletal: Normal range of motion. She exhibits no deformity.  Neurological: She is alert and oriented to person, place, and time.  Skin: Skin is warm and dry.  Psychiatric: She has a normal mood and affect. Her behavior is normal.  Vitals reviewed.    UC Treatments / Results  Labs (all labs ordered are listed, but only abnormal results are displayed) Labs Reviewed  URINALYSIS, COMPLETE (UACMP) WITH MICROSCOPIC - Abnormal; Notable for the following:       Result Value   Color, Urine STRAW (*)    APPearance HAZY (*)    Hgb urine dipstick MODERATE (*)    Protein, ur TRACE (*)    Nitrite POSITIVE (*)    Leukocytes, UA SMALL (*)    Squamous Epithelial / LPF 0-5 (*)    Bacteria, UA FEW (*)    All other components within normal limits  URINE CULTURE    EKG  EKG Interpretation None       Radiology No results found.  Procedures Procedures (including critical care time)  Medications Ordered in UC Medications - No data to display  Results for orders placed or performed during the hospital encounter of 04/13/16  Urinalysis, Complete w Microscopic  Result Value Ref  Range   Color, Urine STRAW (A) YELLOW   APPearance HAZY (A) CLEAR   Specific Gravity, Urine 1.010 1.005 - 1.030   pH 5.5 5.0 - 8.0   Glucose, UA NEGATIVE NEGATIVE mg/dL   Hgb urine dipstick MODERATE (A) NEGATIVE   Bilirubin Urine NEGATIVE NEGATIVE   Ketones, ur NEGATIVE NEGATIVE mg/dL   Protein, ur TRACE (A) NEGATIVE mg/dL   Nitrite POSITIVE (A) NEGATIVE   Leukocytes, UA SMALL (A) NEGATIVE  Squamous Epithelial / LPF 0-5 (A) NONE SEEN   WBC, UA TOO NUMEROUS TO COUNT 0 - 5 WBC/hpf   RBC / HPF 6-30 0 - 5 RBC/hpf   Bacteria, UA FEW (A) NONE SEEN    Initial Impression / Assessment and Plan / UC Course  I have reviewed the triage vital signs and the nursing notes.  Pertinent labs & imaging results that were available during my care of the patient were reviewed by me and considered in my medical decision making (see chart for details).    We'll treat patient with Macrobid 100 mg 1 tablet twice a day Pyridium 1 tablet 3 times a day warned urine may be changed to [place]. Follow-up PCP in 2 weeks not better. Urine culture was also ordered as well.  Final Clinical Impressions(s) / UC Diagnoses   Final diagnoses:  Lower urinary tract infectious disease  Dysuria    New Prescriptions Discharge Medication List as of 04/13/2016  3:08 PM    START taking these medications   Details  nitrofurantoin, macrocrystal-monohydrate, (MACROBID) 100 MG capsule Take 1 capsule (100 mg total) by mouth 2 (two) times daily., Starting Thu 04/13/2016, Normal    phenazopyridine (PYRIDIUM) 200 MG tablet Take 1 tablet (200 mg total) by mouth 3 (three) times daily as needed for pain., Starting Thu 04/13/2016, Normal         Note: This dictation was prepared with Dragon dictation along with smaller phrase technology. Any transcriptional errors that result from this process are unintentional.   Hassan Rowan, MD 04/13/16 1525

## 2016-04-13 NOTE — ED Triage Notes (Signed)
Patient started having symptoms of urinary frequency and burning 2 days ago.

## 2016-04-15 LAB — URINE CULTURE

## 2018-01-24 ENCOUNTER — Emergency Department (HOSPITAL_COMMUNITY)
Admission: EM | Admit: 2018-01-24 | Discharge: 2018-01-24 | Disposition: A | Discharge status: Home / Self Care | Payer: Managed Care, Other (non HMO) | Attending: Emergency Medicine | Admitting: Emergency Medicine

## 2018-01-24 ENCOUNTER — Emergency Department (HOSPITAL_COMMUNITY): Payer: Managed Care, Other (non HMO)

## 2018-01-24 ENCOUNTER — Other Ambulatory Visit: Payer: Self-pay

## 2018-01-24 ENCOUNTER — Encounter (HOSPITAL_COMMUNITY): Payer: Self-pay | Admitting: Emergency Medicine

## 2018-01-24 DIAGNOSIS — R0789 Other chest pain: Secondary | ICD-10-CM | POA: Insufficient documentation

## 2018-01-24 DIAGNOSIS — E039 Hypothyroidism, unspecified: Secondary | ICD-10-CM | POA: Insufficient documentation

## 2018-01-24 DIAGNOSIS — Z79899 Other long term (current) drug therapy: Secondary | ICD-10-CM | POA: Insufficient documentation

## 2018-01-24 DIAGNOSIS — R079 Chest pain, unspecified: Secondary | ICD-10-CM | POA: Diagnosis present

## 2018-01-24 MED ORDER — ACETAMINOPHEN 500 MG PO TABS
1000.0000 mg | ORAL_TABLET | Freq: Once | ORAL | Status: AC
  Administered 2018-01-24: 1000 mg via ORAL
  Filled 2018-01-24: qty 2

## 2018-01-24 MED ORDER — CYCLOBENZAPRINE HCL 10 MG PO TABS: 10.0000 mg | ORAL_TABLET | Freq: Two times a day (BID) | ORAL | 0 refills | Status: AC | PRN

## 2018-01-24 MED ORDER — IBUPROFEN 400 MG PO TABS
400.0000 mg | ORAL_TABLET | Freq: Once | ORAL | Status: AC
  Administered 2018-01-24: 400 mg via ORAL
  Filled 2018-01-24: qty 1
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
## 2018-01-24 NOTE — Discharge Instructions (Signed)
You can take the muscle relaxer only at night to prevent morning stiffness if it makes you sleepy or you can also take it during the day as long as you are not planning on driving.  Also any type of muscle rub, heating pad or hot shower may also help with the aches and pains.  You can continue taking Aleve and can take additional Tylenol as needed for pain.

## 2018-01-24 NOTE — ED Notes (Signed)
Patient states now noticed nose starting to have pain. Currently 2/10 sore.
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
## 2018-01-24 NOTE — ED Notes (Signed)
Patient transported to X-ray 

## 2018-01-24 NOTE — ED Notes (Signed)
Signature pad not working patient verbalized understanding of discharge instructions.

## 2019-05-01 ENCOUNTER — Ambulatory Visit: Payer: Medicare HMO | Attending: Internal Medicine

## 2019-05-01 DIAGNOSIS — Z23 Encounter for immunization: Secondary | ICD-10-CM | POA: Insufficient documentation

## 2019-05-01 NOTE — Progress Notes (Signed)
   Covid-19 Vaccination Clinic  Name:  ANISTON CHRISTMAN    MRN: [id number] DOB: [birth year]  05/01/2019  Ms. Sherrill was observed post Covid-19 immunization for 15 minutes without incidence. She was provided with Vaccine Information Sheet and instruction to access the V-Safe system.   Ms. Godfrey was instructed to call 911 with any severe reactions post vaccine: Marland Kitchen Difficulty breathing  . Swelling of your face and throat  . A fast heartbeat  . A bad rash all over your body  . Dizziness and weakness    Immunizations Administered    Name Date Dose VIS Date Route   Pfizer COVID-19 Vaccine 05/01/2019 10:52 AM 0.3 mL 02/14/2019 Intramuscular   Manufacturer: [employer], [employer]   Lot: [id number]   NDC: [address]-5
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
## 2019-05-28 ENCOUNTER — Ambulatory Visit: Payer: Medicare HMO | Attending: Internal Medicine

## 2019-05-28 DIAGNOSIS — Z23 Encounter for immunization: Secondary | ICD-10-CM

## 2019-05-28 NOTE — Progress Notes (Signed)
   [facility]  Name:  Molly Wells    MRN: [id number] DOB: 1955/11/11  05/28/2019  [name] was observed post Covid-19 immunization for 15 minutes without incident. She was provided with Vaccine Information Sheet and instruction to access the V-Safe system.   [name] was instructed to call 911 with any severe reactions post vaccine: Marland Kitchen Difficulty breathing  . Swelling of face and throat  . A fast heartbeat  . A bad rash all over body  . Dizziness and weakness   Immunizations Administered    Name Date Dose VIS Date Route   Pfizer COVID-19 Vaccine 05/28/2019  8:45 AM 0.3 mL 02/14/2019 Intramuscular   Manufacturer: [employer], [employer]   Lot: [id number]   NDC: [address]-3

## 2019-09-23 ENCOUNTER — Ambulatory Visit
Admission: EM | Admit: 2019-09-23 | Discharge: 2019-09-23 | Disposition: A | Discharge status: Home / Self Care | Payer: Medicare HMO
